# Patient Record
Sex: Male | Born: 1945 | Race: Black or African American | Hispanic: No | Marital: Married | Smoking: Former smoker
Health system: Southern US, Community
[De-identification: ages and names within clinical notes are randomized; demographics above are authoritative.]

## PROBLEM LIST (undated history)

## (undated) DIAGNOSIS — Z87442 Personal history of urinary calculi: Secondary | ICD-10-CM

## (undated) DIAGNOSIS — C801 Malignant (primary) neoplasm, unspecified: Secondary | ICD-10-CM

## (undated) DIAGNOSIS — E785 Hyperlipidemia, unspecified: Secondary | ICD-10-CM

## (undated) DIAGNOSIS — I10 Essential (primary) hypertension: Secondary | ICD-10-CM

## (undated) HISTORY — PX: PROSTATE SURGERY: SHX751

---

## 2006-02-07 ENCOUNTER — Ambulatory Visit: Payer: Self-pay | Admitting: Gastroenterology

## 2006-02-17 ENCOUNTER — Ambulatory Visit: Payer: Self-pay | Admitting: Gastroenterology

## 2008-06-14 IMAGING — RF DG SMALL BOWEL
2 series · 6 of 6 positions shown · non-contrast
Comparison: none

REASON FOR EXAM: SYUJI
COMMENTS:

[Series 1: run · 1 of 1 slices shown]
[im 1/1]
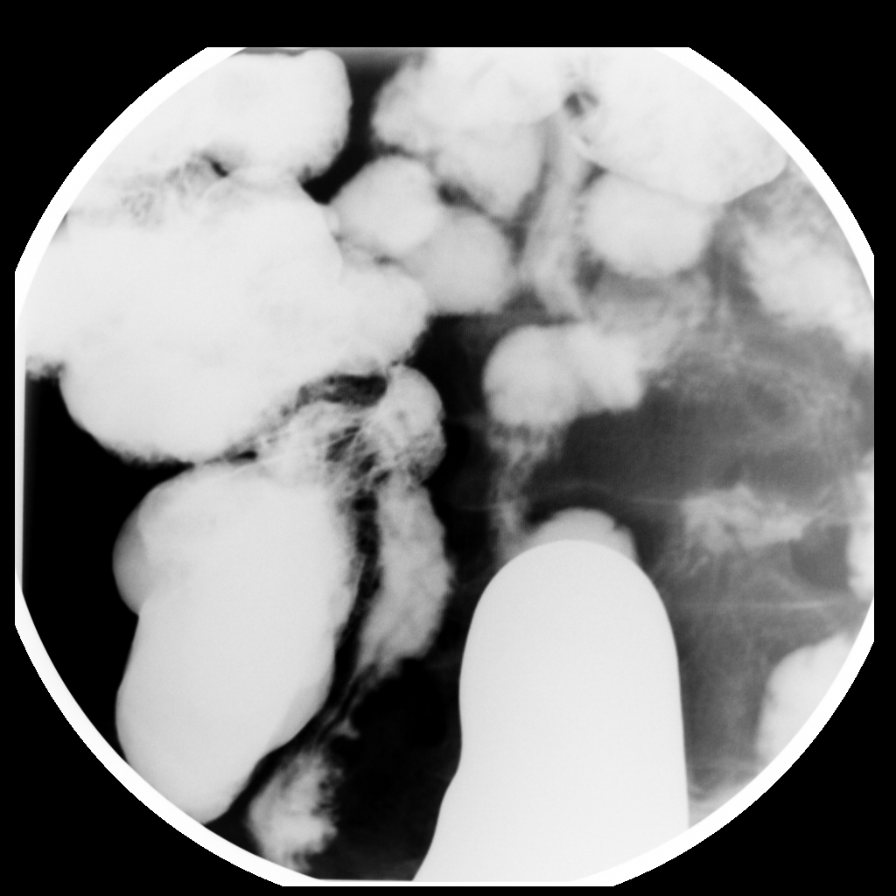

[Series 1: view not recorded · 0.17mm/px · 5 of 5 slices shown]
[im 1/5]
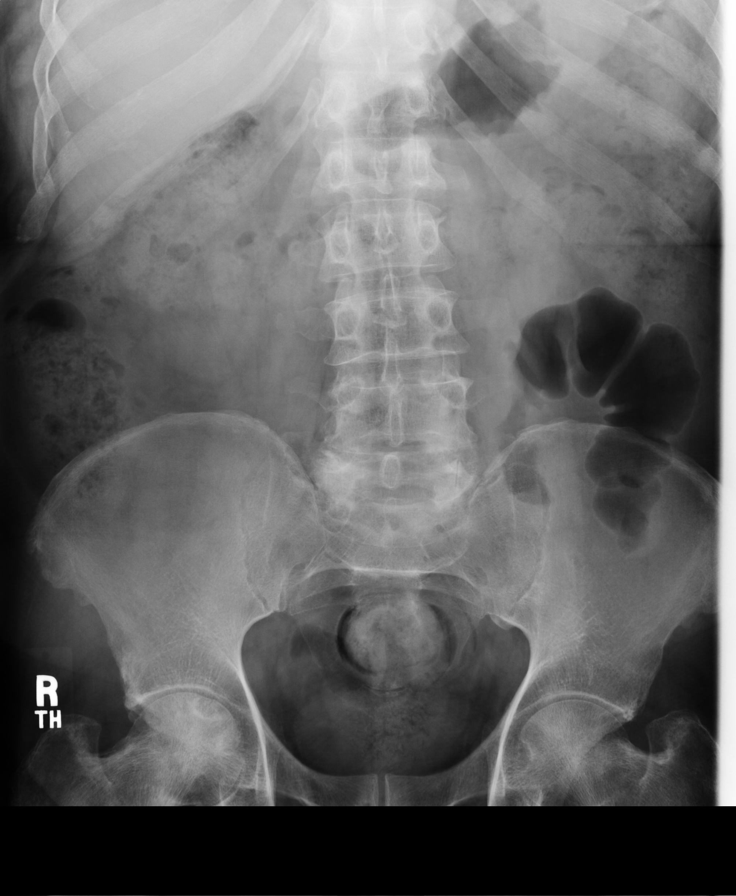
[im 2/5]
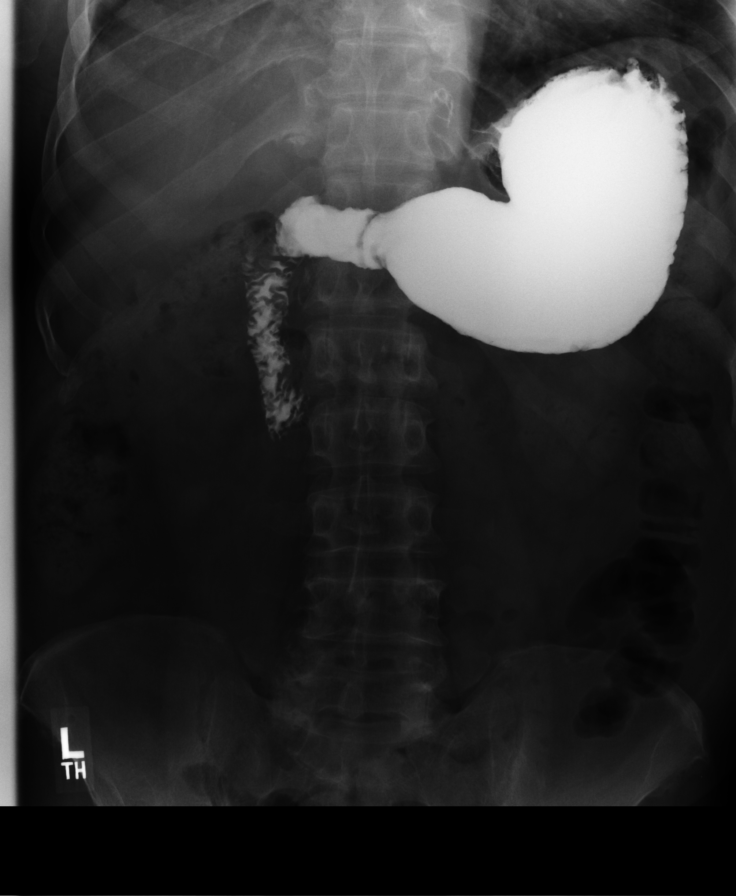
[im 3/5]
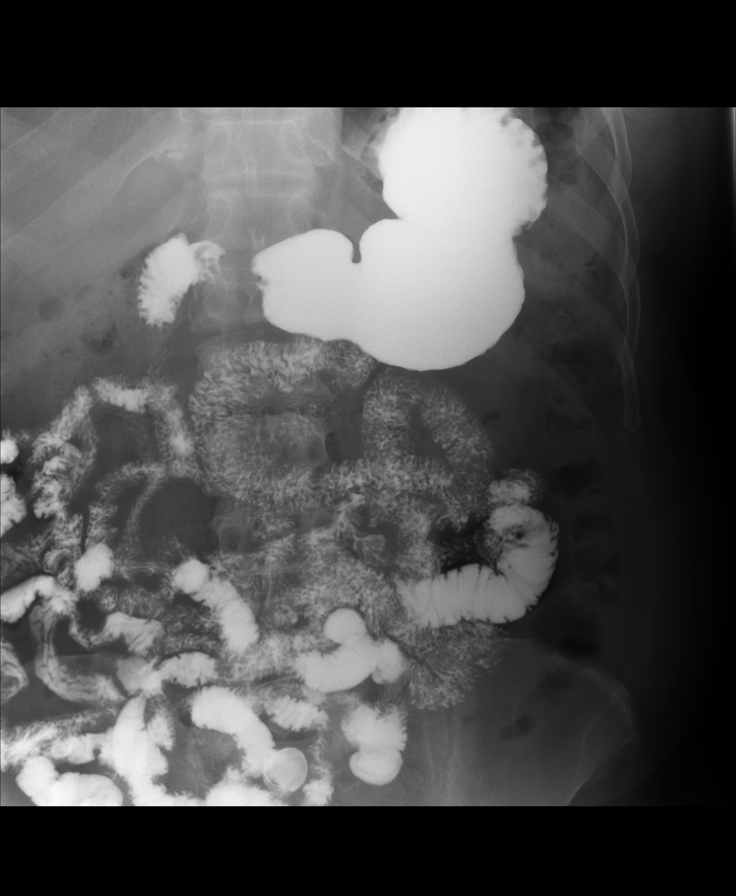
[im 4/5]
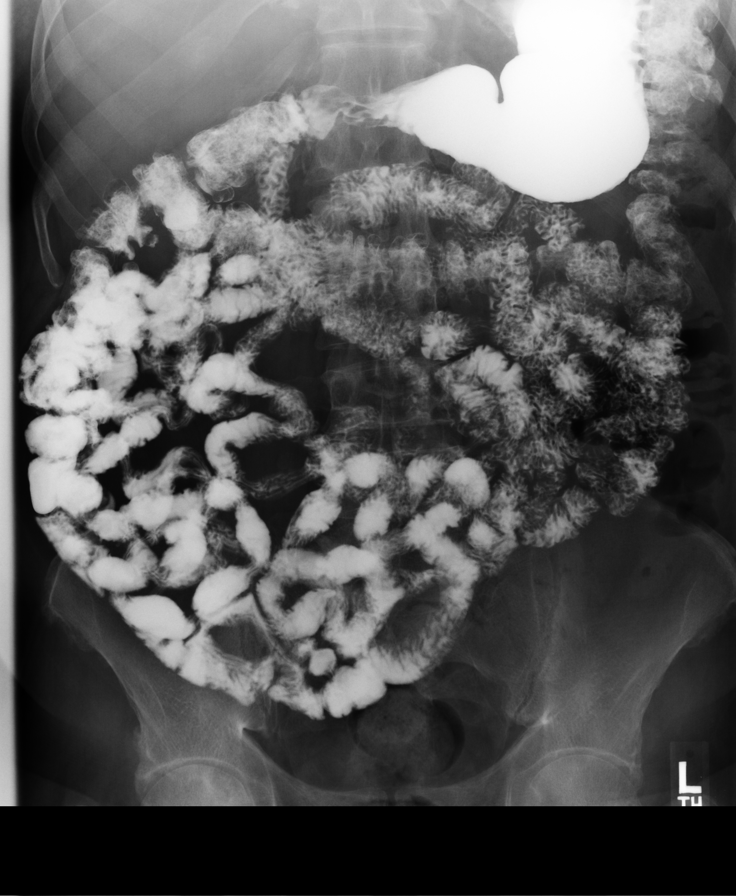
[im 5/5]
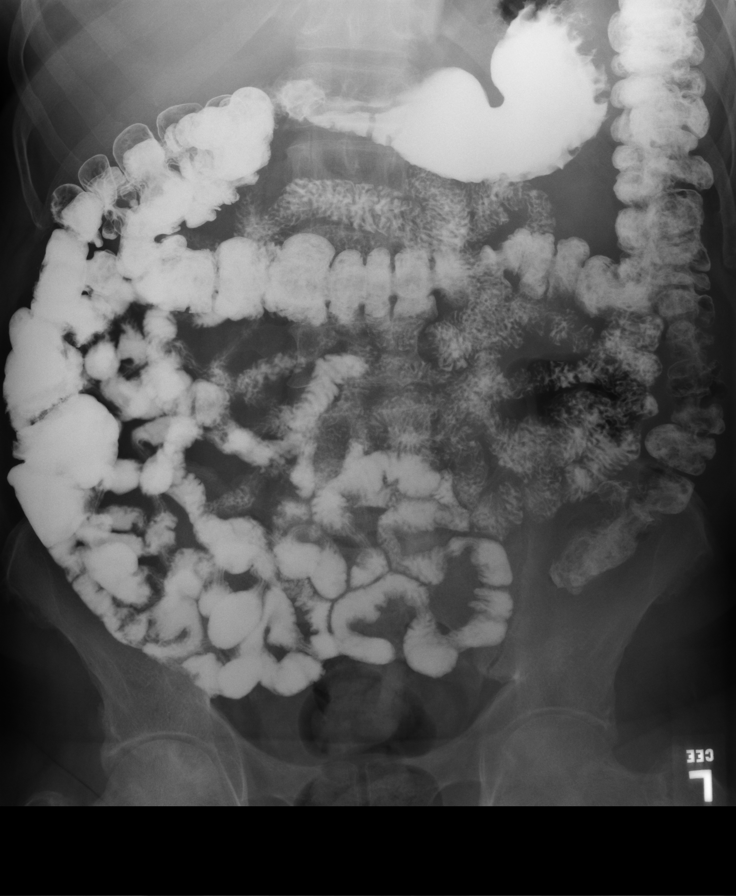

[6 of 6 positions shown; findings below may reference images not displayed]

PROCEDURE:     FL  - FL SMALL BOWEL  - February 17, 2006  [DATE]

RESULT:         Small bowel series shows the barium column reaches the colon
by 30-45 minutes.  No persistent areas of stenosis or abnormal bowel
dilatation are seen.  The mucosal pattern appears to be unremarkable.  No
definite diverticula are seen.  The terminal ileum and ileocecal valve
region are unremarkable.
IMPRESSION: Unremarkable small bowel follow through.

## 2011-11-22 ENCOUNTER — Ambulatory Visit: Payer: Self-pay | Admitting: Gastroenterology

## 2011-11-22 HISTORY — PX: COLONOSCOPY: SHX174

## 2017-04-15 ENCOUNTER — Encounter: Payer: Self-pay | Admitting: *Deleted

## 2017-04-18 ENCOUNTER — Ambulatory Visit: Payer: Medicare Other | Admitting: Anesthesiology

## 2017-04-18 ENCOUNTER — Ambulatory Visit
Admission: RE | Admit: 2017-04-18 | Discharge: 2017-04-18 | Disposition: A | Discharge status: Home / Self Care | Payer: Medicare Other | Source: Ambulatory Visit | Attending: Gastroenterology | Admitting: Gastroenterology

## 2017-04-18 ENCOUNTER — Encounter
Admission: RE | Disposition: A | Discharge status: Home / Self Care | Payer: Self-pay | Source: Ambulatory Visit | Attending: Gastroenterology

## 2017-04-18 ENCOUNTER — Other Ambulatory Visit: Payer: Self-pay

## 2017-04-18 ENCOUNTER — Encounter: Payer: Self-pay | Admitting: Anesthesiology

## 2017-04-18 DIAGNOSIS — E785 Hyperlipidemia, unspecified: Secondary | ICD-10-CM | POA: Diagnosis not present

## 2017-04-18 DIAGNOSIS — D128 Benign neoplasm of rectum: Secondary | ICD-10-CM | POA: Insufficient documentation

## 2017-04-18 DIAGNOSIS — Z87891 Personal history of nicotine dependence: Secondary | ICD-10-CM | POA: Insufficient documentation

## 2017-04-18 DIAGNOSIS — Z86010 Personal history of colonic polyps: Secondary | ICD-10-CM | POA: Diagnosis not present

## 2017-04-18 DIAGNOSIS — Z1211 Encounter for screening for malignant neoplasm of colon: Secondary | ICD-10-CM | POA: Diagnosis present

## 2017-04-18 DIAGNOSIS — K5730 Diverticulosis of large intestine without perforation or abscess without bleeding: Secondary | ICD-10-CM | POA: Diagnosis not present

## 2017-04-18 DIAGNOSIS — Z79899 Other long term (current) drug therapy: Secondary | ICD-10-CM | POA: Insufficient documentation

## 2017-04-18 DIAGNOSIS — Z8546 Personal history of malignant neoplasm of prostate: Secondary | ICD-10-CM | POA: Diagnosis not present

## 2017-04-18 DIAGNOSIS — I10 Essential (primary) hypertension: Secondary | ICD-10-CM | POA: Diagnosis not present

## 2017-04-18 HISTORY — DX: Malignant (primary) neoplasm, unspecified: C80.1

## 2017-04-18 HISTORY — PX: COLONOSCOPY WITH PROPOFOL: SHX5780

## 2017-04-18 HISTORY — DX: Personal history of urinary calculi: Z87.442

## 2017-04-18 HISTORY — DX: Essential (primary) hypertension: I10

## 2017-04-18 HISTORY — DX: Hyperlipidemia, unspecified: E78.5

## 2017-04-18 SURGERY — COLONOSCOPY WITH PROPOFOL
Anesthesia: General

## 2017-04-18 MED ORDER — SODIUM CHLORIDE 0.9 % IV SOLN
INTRAVENOUS | Status: DC
  Administered 2017-04-18: 12:00:00 via INTRAVENOUS

## 2017-04-18 MED ORDER — PROPOFOL 10 MG/ML IV BOLUS
INTRAVENOUS | Status: DC | PRN
  Administered 2017-04-18: 70 mg via INTRAVENOUS

## 2017-04-18 MED ORDER — SODIUM CHLORIDE 0.9 % IV SOLN: INTRAVENOUS | Status: DC

## 2017-04-18 MED ORDER — PROPOFOL 500 MG/50ML IV EMUL
INTRAVENOUS | Status: DC | PRN
  Administered 2017-04-18: 150 ug/kg/min via INTRAVENOUS

## 2017-04-18 MED ORDER — PHENYLEPHRINE HCL 10 MG/ML IJ SOLN
INTRAMUSCULAR | Status: DC | PRN
  Administered 2017-04-18: 100 ug via INTRAVENOUS

## 2017-04-18 NOTE — Anesthesia Postprocedure Evaluation (Signed)
Anesthesia Post Note  Patient: Tony Soto  Procedure(s) Performed: COLONOSCOPY WITH PROPOFOL (N/A )  Patient location during evaluation: PACU Anesthesia Type: General Level of consciousness: awake and alert and oriented Pain management: pain level controlled Vital Signs Assessment: post-procedure vital signs reviewed and stable Respiratory status: spontaneous breathing Cardiovascular status: blood pressure returned to baseline Anesthetic complications: no     Last Vitals:  Vitals:   04/18/17 1200 04/18/17 1332  BP: (!) 145/60 (!) 103/58  Pulse: (!) 57   Resp: 16   Temp: 36.7 C (!) 36.1 C  SpO2: 100%     Last Pain:  Vitals:   04/18/17 1332  TempSrc: Tympanic                 Tylan Kinn

## 2017-04-18 NOTE — Anesthesia Procedure Notes (Signed)
Date/Time: 04/18/2017 1:15 PM Performed by: Nelda Marseille, CRNA Pre-anesthesia Checklist: Patient identified, Emergency Drugs available, Suction available, Patient being monitored and Timeout performed Oxygen Delivery Method: Nasal cannula

## 2017-04-18 NOTE — Anesthesia Post-op Follow-up Note (Signed)
Anesthesia QCDR form completed.        

## 2017-04-18 NOTE — Anesthesia Preprocedure Evaluation (Signed)
Anesthesia Evaluation  Patient identified by MRN, date of birth, ID band Patient awake    Reviewed: Allergy & Precautions, NPO status , Patient's Chart, lab work & pertinent test results  Airway Mallampati: II  TM Distance: >3 FB     Dental  (+) Teeth Intact   Pulmonary former smoker,    Pulmonary exam normal        Cardiovascular hypertension, Pt. on medications Normal cardiovascular exam     Neuro/Psych negative neurological ROS  negative psych ROS   GI/Hepatic negative GI ROS, Neg liver ROS,   Endo/Other  negative endocrine ROS  Renal/GU      Musculoskeletal negative musculoskeletal ROS (+)   Abdominal Normal abdominal exam  (+)   Peds negative pediatric ROS (+)  Hematology negative hematology ROS (+)   Anesthesia Other Findings Past Medical History: No date: Cancer ([place])     Comment:  Prostate  No date: History of kidney stones No date: Hyperlipidemia No date: Hypertension  Reproductive/Obstetrics                             Anesthesia Physical Anesthesia Plan  ASA: II  Anesthesia Plan: General   Post-op Pain Management:    Induction: Intravenous  PONV Risk Score and Plan:   Airway Management Planned: Nasal Cannula  Additional Equipment:   Intra-op Plan:   Post-operative Plan:   Informed Consent: I have reviewed the patients History and Physical, chart, labs and discussed the procedure including the risks, benefits and alternatives for the proposed anesthesia with the patient or authorized representative who has indicated his/her understanding and acceptance.   Dental advisory given  Plan Discussed with: CRNA and Surgeon  Anesthesia Plan Comments:         Anesthesia Quick Evaluation

## 2017-04-18 NOTE — H&P (Signed)
Outpatient short stay form Pre-procedure 04/18/2017 12:40 PM Lollie Sails MD  Primary Physician: Dr. Maryland Pink  Reason for visit: Colonoscopy  History of present illness: Patient is a 72 year old male presenting today as above.  He has personal history of adenomatous colon polyps with his last procedure being done about 5 years ago.  He tolerated prep well.  He takes no aspirin or blood thinning agent.    Current Facility-Administered Medications:  .  0.9 %  sodium chloride infusion, , Intravenous, Continuous, Lollie Sails, MD, Last Rate: 20 mL/hr at 04/18/17 1220 .  0.9 %  sodium chloride infusion, , Intravenous, Continuous, Lollie Sails, MD  Medications Prior to Admission  Medication Sig Dispense Refill Last Dose  . atorvastatin (LIPITOR) 40 MG tablet Take 40 mg by mouth daily.   04/17/2017 at Unknown time  . lisinopril-hydrochlorothiazide (PRINZIDE,ZESTORETIC) 20-25 MG tablet Take 1 tablet by mouth daily.   Not Taking at Unknown time  . sildenafil (VIAGRA) 100 MG tablet Take 100 mg by mouth daily as needed for erectile dysfunction (1 hour before intercourse).   Not Taking at Unknown time     Not on File   Past Medical History:  Diagnosis Date  . Cancer [facility])    Prostate   . History of kidney stones   . Hyperlipidemia   . Hypertension     Review of systems:      Physical Exam    Heart and lungs: Regular rate and rhythm without rub or gallop, lungs are bilaterally clear.    HEENT: Normocephalic atraumatic eyes are anicteric    Other:    Pertinant exam for procedure: Soft nontender nondistended bowel sounds positive normoactive. I have discussed the risks benefits and complications of procedures to include not limited to bleeding, infection, perforation and the risk of sedation and the patient wishes to proceed.    Planned proceedures: Colonoscopy and indicated procedures. I have discussed the risks benefits and complications of procedures to  include not limited to bleeding, infection, perforation and the risk of sedation and the patient wishes to proceed.    Lollie Sails, MD Gastroenterology 04/18/2017  12:40 PM

## 2017-04-18 NOTE — Op Note (Signed)
[facility] Gastroenterology Patient Name: Tony Soto Procedure Date: 04/18/2017 1:00 PM MRN: [id number] Account #: [id number] Date of Birth: [birth year] Admit Type: Outpatient Age: 72 Room: [facility] ENDO ROOM 1 Gender: Male Note Status: Finalized Procedure:            Colonoscopy Indications:          Personal history of colonic polyps Providers:            Lollie Sails, MD Referring MD:         Irven Easterly. Kary Kos, MD (Referring MD) Medicines:            Monitored Anesthesia Care Complications:        No immediate complications. Procedure:            Pre-Anesthesia Assessment:                       - ASA Grade Assessment: II - A patient with mild                        systemic disease.                       After obtaining informed consent, the colonoscope was                        passed under direct vision. Throughout the procedure,                        the patient's blood pressure, pulse, and oxygen                        saturations were monitored continuously. The                        Colonoscope was introduced through the anus and                        advanced to the the cecum, identified by appendiceal                        orifice and ileocecal valve. The colonoscopy was                        performed without difficulty. The patient tolerated the                        procedure well. The quality of the bowel preparation                        was fair. Findings:      Multiple small-mouthed diverticula were found in the sigmoid colon,       descending colon and transverse colon.      A 3 mm polyp was found in the rectum. The polyp was sessile. The polyp       was removed with a cold biopsy forceps. Resection and retrieval were       complete.      The retroflexed view of the distal rectum and anal verge was normal and       showed no anal or rectal abnormalities.      The digital rectal exam was normal.  Impression:           - Preparation  of the colon was fair.                       - Diverticulosis in the sigmoid colon, in the                        descending colon and in the transverse colon.                       - One 3 mm polyp in the rectum, removed with a cold                        biopsy forceps. Resected and retrieved. Recommendation:       - Discharge patient to home.                       - Telephone GI clinic for pathology results in 1 week. Procedure Code(s):    --- Professional ---                       [phone], Colonoscopy, flexible; with biopsy, single or                        multiple Diagnosis Code(s):    --- Professional ---                       K62.1, Rectal polyp                       Z86.010, Personal history of colonic polyps                       K57.30, Diverticulosis of large intestine without                        perforation or abscess without bleeding CPT copyright 2016 [employer]. All rights reserved. The codes documented in this report are preliminary and upon coder review may  be revised to meet current compliance requirements. Lollie Sails, MD 04/18/2017 1:32:43 PM This report has been signed electronically. Number of Addenda: 0 Note Initiated On: 04/18/2017 1:00 PM Scope Withdrawal Time: 0 hours 8 minutes 42 seconds  Total Procedure Duration: 0 hours 19 minutes 31 seconds       [facility]

## 2017-04-18 NOTE — Transfer of Care (Signed)
Immediate Anesthesia Transfer of Care Note  Patient: Tony Soto  Procedure(s) Performed: COLONOSCOPY WITH PROPOFOL (N/A )  Patient Location: PACU  Anesthesia Type:General  Level of Consciousness: sedated  Airway & Oxygen Therapy: Patient Spontanous Breathing and Patient connected to nasal cannula oxygen  Post-op Assessment: Report given to RN and Post -op Vital signs reviewed and stable  Post vital signs: Reviewed and stable  Last Vitals:  Vitals:   04/18/17 1200 04/18/17 1332  BP: (!) 145/60 (!) 103/58  Pulse: (!) 57   Resp: 16   Temp: 36.7 C (!) 36.1 C  SpO2: 100%     Last Pain:  Vitals:   04/18/17 1332  TempSrc: Tympanic         Complications: No apparent anesthesia complications

## 2017-04-19 ENCOUNTER — Encounter: Payer: Self-pay | Admitting: Gastroenterology

## 2017-04-19 LAB — SURGICAL PATHOLOGY

## 2019-02-22 ENCOUNTER — Other Ambulatory Visit: Payer: Self-pay

## 2019-02-22 DIAGNOSIS — Z20822 Contact with and (suspected) exposure to covid-19: Secondary | ICD-10-CM

## 2019-02-24 LAB — NOVEL CORONAVIRUS, NAA: SARS-CoV-2, NAA: NOT DETECTED

## 2019-05-07 ENCOUNTER — Ambulatory Visit: Payer: Medicare Other | Attending: Internal Medicine

## 2019-05-07 DIAGNOSIS — Z23 Encounter for immunization: Secondary | ICD-10-CM

## 2019-05-07 NOTE — Progress Notes (Signed)
   Covid-19 Vaccination Clinic  Name:  Tony Soto    MRN: [id number] DOB: 1945/12/08  05/07/2019  Tony Soto was observed post Covid-19 immunization for 15 minutes without incidence. He was provided with Vaccine Information Sheet and instruction to access the V-Safe system.   Tony Soto was instructed to call 911 with any severe reactions post vaccine: [name] Kitchen Difficulty breathing  . Swelling of your face and throat  . A fast heartbeat  . A bad rash all over your body  . Dizziness and weakness    Immunizations Administered    Name Date Dose VIS Date Route   Moderna COVID-19 Vaccine 05/07/2019 12:20 PM 0.5 mL 02/13/2019 Intramuscular   Manufacturer: Moderna   Lot: [id number]   [id number]

## 2019-06-05 ENCOUNTER — Ambulatory Visit: Payer: Medicare Other | Attending: Internal Medicine

## 2019-06-05 DIAGNOSIS — Z23 Encounter for immunization: Secondary | ICD-10-CM

## 2019-06-05 NOTE — Progress Notes (Signed)
   Covid-19 Vaccination Clinic  Name:  Tony Soto    MRN: [id number] DOB: [birth year]  06/05/2019  [name] was observed post Covid-19 immunization for 15 minutes without incident. He was provided with Vaccine Information Sheet and instruction to access the V-Safe system.   [name] was instructed to call 911 with any severe reactions post vaccine: [name] Kitchen Difficulty breathing  . Swelling of face and throat  . A fast heartbeat  . A bad rash all over body  . Dizziness and weakness   Immunizations Administered    Name Date Dose VIS Date Route   Moderna COVID-19 Vaccine 06/05/2019  3:12 PM 0.5 mL 02/13/2019 Intramuscular   Manufacturer: Levan Hurst   LotKK:[id number]   [id number]

## 2020-08-14 ENCOUNTER — Other Ambulatory Visit: Payer: Self-pay | Admitting: Physician Assistant
